# Patient Record
Sex: Female | Born: 1975
Health system: Southern US, Community
[De-identification: ages and names within clinical notes are randomized; demographics above are authoritative.]

## PROBLEM LIST (undated history)

## (undated) DIAGNOSIS — K589 Irritable bowel syndrome without diarrhea: Secondary | ICD-10-CM

## (undated) HISTORY — DX: Irritable bowel syndrome without diarrhea: K58.9

---

## 2006-07-23 ENCOUNTER — Other Ambulatory Visit: Payer: Self-pay

## 2006-07-24 ENCOUNTER — Observation Stay: Payer: Self-pay | Admitting: *Deleted

## 2009-11-21 ENCOUNTER — Ambulatory Visit: Payer: Self-pay | Admitting: Family Medicine

## 2009-12-03 ENCOUNTER — Ambulatory Visit: Payer: Self-pay | Admitting: Family Medicine

## 2012-02-14 ENCOUNTER — Ambulatory Visit: Payer: Self-pay | Admitting: Internal Medicine

## 2012-04-11 ENCOUNTER — Encounter: Payer: Self-pay | Admitting: Obstetrics and Gynecology

## 2012-07-06 ENCOUNTER — Observation Stay: Payer: Self-pay | Admitting: Obstetrics & Gynecology

## 2012-07-08 ENCOUNTER — Inpatient Hospital Stay: Payer: Self-pay | Admitting: Obstetrics and Gynecology

## 2012-07-08 LAB — BASIC METABOLIC PANEL
Anion Gap: 11 (ref 7–16)
BUN: 5 mg/dL — ABNORMAL LOW (ref 7–18)
Calcium, Total: 8.6 mg/dL (ref 8.5–10.1)
Chloride: 109 mmol/L — ABNORMAL HIGH (ref 98–107)
Co2: 19 mmol/L — ABNORMAL LOW (ref 21–32)
Creatinine: 0.5 mg/dL — ABNORMAL LOW (ref 0.60–1.30)
EGFR (African American): >60
EGFR (Non-African Amer.): >60
Glucose: 77 mg/dL (ref 65–99)
Osmolality: 274 (ref 275–301)
Potassium: 3.7 mmol/L (ref 3.5–5.1)
Sodium: 139 mmol/L (ref 136–145)

## 2012-07-08 LAB — HEMATOCRIT: HCT: 35 % (ref 35.0–47.0)

## 2012-07-08 LAB — PROTEIN / CREATININE RATIO, URINE
Creatinine, Urine: 156.7 mg/dL — ABNORMAL HIGH (ref 30.0–125.0)
Protein, Random Urine: 28 mg/dL — ABNORMAL HIGH (ref 0–12)
Protein/Creat. Ratio: 179 mg/gCREAT (ref 0–200)

## 2012-07-08 LAB — HEMOGLOBIN: HGB: 12.3 g/dL (ref 12.0–16.0)

## 2012-07-08 LAB — WBC: WBC: 9.8 10*3/uL (ref 3.6–11.0)

## 2012-07-08 LAB — RBC: RBC: 4.09 10*6/uL (ref 3.80–5.20)

## 2012-07-08 LAB — PLATELET COUNT: Platelet: 192 10*3/uL (ref 150–440)

## 2012-07-08 LAB — SGOT (AST)(ARMC): SGOT(AST): 17 U/L (ref 15–37)

## 2012-07-08 LAB — URIC ACID: Uric Acid: 4.4 mg/dL (ref 2.6–6.0)

## 2012-07-09 LAB — HEMATOCRIT: HCT: 27.5 % — ABNORMAL LOW (ref 35.0–47.0)

## 2013-05-02 ENCOUNTER — Ambulatory Visit: Payer: Self-pay | Admitting: Family Medicine

## 2013-11-15 ENCOUNTER — Emergency Department (HOSPITAL_COMMUNITY): Payer: No Typology Code available for payment source

## 2013-11-15 ENCOUNTER — Emergency Department (HOSPITAL_COMMUNITY)
Admission: EM | Admit: 2013-11-15 | Discharge: 2013-11-15 | Disposition: A | Discharge status: Home / Self Care | Payer: No Typology Code available for payment source | Attending: Emergency Medicine | Admitting: Emergency Medicine

## 2013-11-15 ENCOUNTER — Encounter (HOSPITAL_COMMUNITY): Payer: Self-pay | Admitting: Emergency Medicine

## 2013-11-15 DIAGNOSIS — Y92410 Unspecified street and highway as the place of occurrence of the external cause: Secondary | ICD-10-CM | POA: Insufficient documentation

## 2013-11-15 DIAGNOSIS — S8000XA Contusion of unspecified knee, initial encounter: Secondary | ICD-10-CM | POA: Insufficient documentation

## 2013-11-15 DIAGNOSIS — Y9389 Activity, other specified: Secondary | ICD-10-CM | POA: Insufficient documentation

## 2013-11-15 MED ORDER — HYDROCODONE-ACETAMINOPHEN 5-325 MG PO TABS
1.0000 | ORAL_TABLET | Freq: Once | ORAL | Status: AC
  Administered 2013-11-15: 1 via ORAL
  Filled 2013-11-15: qty 1

## 2013-11-15 MED ORDER — CYCLOBENZAPRINE HCL 10 MG PO TABS: 10.0000 mg | ORAL_TABLET | Freq: Two times a day (BID) | ORAL | Status: DC | PRN

## 2013-11-15 MED ORDER — IBUPROFEN 800 MG PO TABS: 800.0000 mg | ORAL_TABLET | Freq: Three times a day (TID) | ORAL | Status: DC

## 2013-11-15 MED ORDER — HYDROCODONE-ACETAMINOPHEN 5-325 MG PO TABS: 1.0000 | ORAL_TABLET | ORAL | Status: DC | PRN

## 2013-11-15 NOTE — Discharge Instructions (Signed)
Contusion °A contusion is a deep bruise. Contusions are the result of an injury that caused bleeding under the skin. The contusion may turn blue, purple, or yellow. Minor injuries will give you a painless contusion, but more severe contusions may stay painful and swollen for a few weeks.  °CAUSES  °A contusion is usually caused by a blow, trauma, or direct force to an area of the body. °SYMPTOMS  °· Swelling and redness of the injured area. °· Bruising of the injured area. °· Tenderness and soreness of the injured area. °· Pain. °DIAGNOSIS  °The diagnosis can be made by taking a history and physical exam. An X-ray, CT scan, or MRI may be needed to determine if there were any associated injuries, such as fractures. °TREATMENT  °Specific treatment will depend on what area of the body was injured. In general, the best treatment for a contusion is resting, icing, elevating, and applying cold compresses to the injured area. Over-the-counter medicines may also be recommended for pain control. Ask your caregiver what the best treatment is for your contusion. °HOME CARE INSTRUCTIONS  °· Put ice on the injured area. °· Put ice in a plastic bag. °· Place a towel between your skin and the bag. °· Leave the ice on for 15-20 minutes, 03-04 times a day. °· Only take over-the-counter or prescription medicines for pain, discomfort, or fever as directed by your caregiver. Your caregiver may recommend avoiding anti-inflammatory medicines (aspirin, ibuprofen, and naproxen) for 48 hours because these medicines may increase bruising. °· Rest the injured area. °· If possible, elevate the injured area to reduce swelling. °SEEK IMMEDIATE MEDICAL CARE IF:  °· You have increased bruising or swelling. °· You have pain that is getting worse. °· Your swelling or pain is not relieved with medicines. °MAKE SURE YOU:  °· Understand these instructions. °· Will watch your condition. °· Will get help right away if you are not doing well or get  worse. °Document Released: 05/20/2005 Document Revised: 11/02/2011 Document Reviewed: 06/15/2011 °[employer]® Patient Information ©2014 [employer]. ° °Cryotherapy °Cryotherapy means treatment with cold. Ice or gel packs can be used to reduce both pain and swelling. Ice is the most helpful within the first 24 to 48 hours after an injury or flareup from overusing a muscle or joint. Sprains, strains, spasms, burning pain, shooting pain, and aches can all be eased with ice. Ice can also be used when recovering from surgery. Ice is effective, has very few side effects, and is safe for most people to use. °PRECAUTIONS  °Ice is not a safe treatment option for people with: °· Raynaud's phenomenon. This is a condition affecting small blood vessels in the extremities. Exposure to cold may cause your problems to return. °· Cold hypersensitivity. There are many forms of cold hypersensitivity, including: °· Cold urticaria. Red, itchy hives appear on the skin when the tissues begin to warm after being iced. °· Cold erythema. This is a red, itchy rash caused by exposure to cold. °· Cold hemoglobinuria. Red blood cells break down when the tissues begin to warm after being iced. The hemoglobin that carry oxygen are passed into the urine because they cannot combine with blood proteins fast enough. °· Numbness or altered sensitivity in the area being iced. °If you have any of the following conditions, do not use ice until you have discussed cryotherapy with your caregiver: °· Heart conditions, such as arrhythmia, angina, or chronic heart disease. °· High blood pressure. °· Healing wounds or open skin   in the area being iced.  Current infections.  Rheumatoid arthritis.  Poor circulation.  Diabetes. Ice slows the blood flow in the region it is applied. This is beneficial when trying to stop inflamed tissues from spreading irritating chemicals to surrounding tissues. However, if you expose your skin to cold temperatures for too  long or without the proper protection, you can damage your skin or nerves. Watch for signs of skin damage due to cold. HOME CARE INSTRUCTIONS Follow these tips to use ice and cold packs safely.  Place a dry or damp towel between the ice and skin. A damp towel will cool the skin more quickly, so you may need to shorten the time that the ice is used.  For a more rapid response, add gentle compression to the ice.  Ice for no more than 10 to 20 minutes at a time. The bonier the area you are icing, the less time it will take to get the benefits of ice.  Check your skin after 5 minutes to make sure there are no signs of a poor response to cold or skin damage.  Rest 20 minutes or more in between uses.  Once your skin is numb, you can end your treatment. You can test numbness by very lightly touching your skin. The touch should be so light that you do not see the skin dimple from the pressure of your fingertip. When using ice, most people will feel these normal sensations in this order: cold, burning, aching, and numbness.  Do not use ice on someone who cannot communicate their responses to pain, such as small children or people with dementia. HOW TO MAKE AN ICE PACK Ice packs are the most common way to use ice therapy. Other methods include ice massage, ice baths, and cryo-sprays. Muscle creams that cause a cold, tingly feeling do not offer the same benefits that ice offers and should not be used as a substitute unless recommended by your caregiver. To make an ice pack, do one of the following:  Place crushed ice or a bag of frozen vegetables in a sealable plastic bag. Squeeze out the excess air. Place this bag inside another plastic bag. Slide the bag into a pillowcase or place a damp towel between your skin and the bag.  Mix 3 parts water with 1 part rubbing alcohol. Freeze the mixture in a sealable plastic bag. When you remove the mixture from the freezer, it will be slushy. Squeeze out the excess  air. Place this bag inside another plastic bag. Slide the bag into a pillowcase or place a damp towel between your skin and the bag. SEEK MEDICAL CARE IF:  You develop white spots on your skin. This may give the skin a blotchy (mottled) appearance.  Your skin turns blue or pale.  Your skin becomes waxy or hard.  Your swelling gets worse. MAKE SURE YOU:   Understand these instructions.  Will watch your condition.  Will get help right away if you are not doing well or get worse. Document Released: 04/06/2011 Document Revised: 11/02/2011 Document Reviewed: 04/06/2011 [facility] Patient Information 2014 [place], [place]. Motor Vehicle Collision  It is common to have multiple bruises and sore muscles after a motor vehicle collision (MVC). These tend to feel worse for the first 24 hours. You may have the most stiffness and soreness over the first several hours. You may also feel worse when you wake up the first morning after your collision. After this point, you will usually begin to  improve with each day. The speed of improvement often depends on the severity of the collision, the number of injuries, and the location and nature of these injuries. HOME CARE INSTRUCTIONS   Put ice on the injured area.  Put ice in a plastic bag.  Place a towel between your skin and the bag.  Leave the ice on for 15-20 minutes, 03-04 times a day.  Drink enough fluids to keep your urine clear or pale yellow. Do not drink alcohol.  Take a warm shower or bath once or twice a day. This will increase blood flow to sore muscles.  You may return to activities as directed by your caregiver. Be careful when lifting, as this may aggravate neck or back pain.  Only take over-the-counter or prescription medicines for pain, discomfort, or fever as directed by your caregiver. Do not use aspirin. This may increase bruising and bleeding. SEEK IMMEDIATE MEDICAL CARE IF:  You have numbness, tingling, or weakness in the arms  or legs.  You develop severe headaches not relieved with medicine.  You have severe neck pain, especially tenderness in the middle of the back of your neck.  You have changes in bowel or bladder control.  There is increasing pain in any area of the body.  You have shortness of breath, lightheadedness, dizziness, or fainting.  You have chest pain.  You feel sick to your stomach (nauseous), throw up (vomit), or sweat.  You have increasing abdominal discomfort.  There is blood in your urine, stool, or vomit.  You have pain in your shoulder (shoulder strap areas).  You feel your symptoms are getting worse. MAKE SURE YOU:   Understand these instructions.  Will watch your condition.  Will get help right away if you are not doing well or get worse. Document Released: 08/10/2005 Document Revised: 11/02/2011 Document Reviewed: 01/07/2011 [facility] Patient Information 2014 [place], [place].

## 2013-11-15 NOTE — ED Notes (Signed)
No LOC. Ice applied to both knees.
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
## 2013-11-15 NOTE — ED Notes (Signed)
Returned from xray
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
## 2013-11-24 NOTE — ED Provider Notes (Signed)
Medical screening examination/treatment/procedure(s) were performed by non-physician practitioner and as supervising physician I was immediately available for consultation/collaboration.   EKG Interpretation None        Girl Schissler S Marytza Grandpre, MD 11/24/13 1519 

## 2014-12-11 NOTE — Op Note (Signed)
PATIENT NAME:  Angela Mcneil, Mcneil MR#:  [id number] DATE OF BIRTH:  Nov 03, 1975  DATE OF PROCEDURE:  07/08/2012  PREOPERATIVE DIAGNOSES:  1. Term intrauterine pregnancy at 39 weeks 0 days gestation.  2. Fetal intolerance to labor.   POSTOPERATIVE DIAGNOSES:  1. Term intrauterine pregnancy at 39 weeks 0 days gestation.  2. Fetal intolerance to labor.   PROCEDURE PERFORMED: Primary low transverse cesarean section via Pfannenstiel skin incision.   ANESTHESIA USED: Spinal.   PRIMARY SURGEON: Florina Ou. Bonney Aid, MD  ESTIMATED BLOOD LOSS: 500 mL.  OPERATIVE FLUIDS: 1300 mL of crystalloid.   URINE OUTPUT: 75 mL of clear urine.   COMPLICATIONS: None.   INTRAOPERATIVE FINDINGS: Normal tubes and ovaries. The uterus was significant for four pedunculated fibroids in the posterior aspect of the left portion of the uterus. The largest of these measured around 4.5 cm. Delivery resulted in birth of a liveborn female infant weighing 2600 grams (5 pounds 2 ounces), Apgars 8 and 9.   DRAINS OR TUBES: Foley to gravity drainage, On-Q catheter system.   PREOPERATIVE ANTIBIOTIC: 2 grams Ancef.   SPECIMENS REMOVED: Placenta.   PATIENT CONDITION FOLLOWING PROCEDURE: Stable.   PROCEDURE IN DETAIL: Risks, benefits, and alternatives of the procedure were discussed with patient prior to proceeding to the Operating Room. The patient had presented in term labor having made change from 1 cm to 3 cm over two hours of monitoring. She displayed reassuring fetal surveillance on admission but had an unprovoked deceleration down to 60 beats per minute which lasted approximately nine minutes and did not respond to position changes, supplemental O2 or fetal scalp stem. Upon recovery the infant was noted to be tachycardiac into the 170s with minimal variability and still displaying intermittent decelerations. These were subtle down to the 160s-150s, lasting two or three minutes each. The decision was made given that she remote  from delivery and the infant did not respond to in utero resuscitative measures, the appropriate thing would be to proceed with operative delivery. The patient was in agreement. She was taken to the Operating Room where she was placed under spinal anesthesia. She was positioned in the supine position, prepped and draped in the usual sterile fashion. A timeout was performed. A Pfannenstiel skin incision was made 2 cm above the pubic symphysis, carried down sharply to the level of the rectus fascia. The fascia was incised in the midline and the fascial incision was extended using Mayo scissors. The superior border of the rectus fascia was grasped with two Kocher clamps. The underlying muscles was dissected off the rectus fascia using blunt dissection. Median raphe was incised using Mayo scissors. The inferior border of the rectus fascia was dissected off the rectus muscle in a similar fashion. The midline was identified. The rectus muscles were separated bluntly and the peritoneum was entered bluntly. The peritoneal incision was extended using manual traction. A bladder blade was placed. A bladder flap was then created using Metzenbaum scissors and developed digitally. The bladder blade was replaced retracting the bladder caudad. Following this, hysterotomy incision was made on the uterus. After scoring the hysterotomy incision the uterus was entered bluntly using the operator's finger. The hysterotomy incision was extended using manual traction. Upon placing the operator's hand into the uterus the infant was noted to be in the OA position. The vertex was grasped, flexed, brought to the incision, delivered atraumatically using fundal pressure. The remainder of the infant's body delivered without difficulty. The infant was suctioned, cord was clamped and cut.  The infant was passed to the awaiting pediatrician. Cord blood was obtained. The placenta was delivered using manual extraction. The uterus was exteriorized with  some difficulty at which time the above noted fibroids were noted. The uterus was wiped clean of clots and debris. The hysterotomy incision was closed using a two layer closure of 0 Vicryl with the first being a running locked, the second a vertical imbricating layer. The hysterotomy incision was inspected and noted to be hemostatic. The uterus returned to the abdomen after irrigating the pelvis with warm saline. Following return of the uterus into the abdomen the peritoneum was closed using 2-0 Vicryl in a running fashion. The On-Q catheter system was placed 4 cm above the midline and placed subfascially. The trocars were removed and the On-Q catheters were threaded through the introducers and the introducers were removed. The fascia was closed using a single #1 looped PDS in a running fashion, subcutaneous tissue was then irrigated. Hemostasis achieved using the Bovie. The skin was closed using Insorb staples. The On-Q catheters were dressed with Dermabond as was the Pfannenstiel skin incision. Each On-Q catheter was then bolused with 5 mL of 0.5% bupivacaine each. Sponge, needle, and instrument counts were correct x2. The patient tolerated the procedure well, was taken to the recovery room in stable condition.   ____________________________ Florina OuAndreas M. Bonney AidStaebler, MD ams:cms D: 07/09/2012 23:17:00 ET T: 07/10/2012 12:49:41 ET JOB#: [id number]  cc: Florina OuAndreas M. Bonney AidStaebler, MD, <Dictator>  Lorrene ReidANDREAS M Tayleigh Wetherell MD ELECTRONICALLY SIGNED 07/12/2012 22:03

## 2015-01-01 NOTE — H&P (Signed)
L&D Evaluation:  History:   HPI 39yo G2P0010 at 2066w0d by D=7wk U/S presents contractions has been 1cm over last week.  [facility] uncom[facility] aside from elevated DSR 1:180 (no further work up), fibroid uterus with pedunculated fibroid 4x5cm and LUS fibroid 3x4cm. PNL - O-, R NI, VI, ASCUS HPV+ pap (benign colpo).    Patient's Medical History HTN (normotensive throughout pregnancy), leiomyomata, kidney calculi s/p stenting    Patient's Surgical History D&C  lithotripsy, ureteral stenting    Medications Pre Natal Vitamins    Allergies darvocet - hallucinations    Social History none    Family History Non-Contributory   ROS:   ROS All systems were reviewed.  HEENT, CNS, GI, GU, Respiratory, CV, Renal and Musculoskeletal systems were found to be normal.   Exam:   Vital Signs stable    General no apparent distress    Mental Status clear    Chest nl effort    Abdomen gravid, non-tender    Estimated Fetal Weight Average for gestational age    Fetal Position vtx    Pelvic 1 / 50 on admission made changed to 3/50/-3    Mebranes Intact    FHT normal rate with no decels    Ucx irregular, q3-365min   Plan:   Plan EFM/NST, monitor BP, admit for term labor    Comments - preE labs sent given several borderline BP's in 130's, returned normal.  Pr/Cr ratio pending.  Has history of CHTN    Electronic Signatures: Lorrene ReidStaebler, Russel Morain M (MD)  (Signed [phone]-Nov-13 11:28)  Authored: L&D Evaluation   Last Updated: 15-Nov-13 11:28 by Lorrene ReidStaebler, Aniketh Huberty M (MD)

## 2015-01-01 NOTE — H&P (Signed)
L&D Evaluation:  History:   HPI 39yo G2P0010 at 5952w5d by D=7wk U/S presents with lower abdomen / pelvic pressure and occasional contractions.  Checked in office last week and found to be 1cm.  [facility] aside from elevated DSR 1:180 (no further work up), fibroid uterus with pedunculated fibroid 4x5cm and LUS fibroid 3x4cm. PNL - O-, R NI, VI, ASCUS HPV+ pap (benign colpo).    Patient's Medical History HTN (normotensive throughout pregnancy), leiomyomata, kidney calculi s/p stenting    Patient's Surgical History D&C  lithotripsy, ureteral stenting    Medications Pre Natal Vitamins    Allergies darvocet - hallucinations    Social History none    Family History Non-Contributory   ROS:   ROS All systems were reviewed.  HEENT, CNS, GI, GU, Respiratory, CV, Renal and Musculoskeletal systems were found to be normal.   Exam:   Vital Signs stable    General no apparent distress    Mental Status clear    Chest nl effort    Abdomen gravid, non-tender    Estimated Fetal Weight Average for gestational age    Pelvic 1 / 450    Mebranes Intact    FHT normal rate with no decels, 140s, moderate variability, + accels, no decels    Ucx irregular, every 5-2510min   Impression:   Impression Presented for r/o labor - not currently in labor   Plan:   [facility]    Follow Up Appointment already scheduled. in 2 days   Electronic Signatures: Garnette GunnerStansbury Clipp, Ali LoweEryn K (MD)  (Signed [phone]-Nov-13 20:17)  Authored: L&D Evaluation   Last Updated: 13-Nov-13 20:17 by Garnette GunnerStansbury Clipp, Ali LoweEryn K (MD)

## 2015-12-01 ENCOUNTER — Encounter: Payer: Self-pay | Admitting: *Deleted

## 2015-12-01 ENCOUNTER — Ambulatory Visit
Admission: EM | Admit: 2015-12-01 | Discharge: 2015-12-01 | Disposition: A | Discharge status: Home / Self Care | Payer: BLUE CROSS/BLUE SHIELD | Attending: Family Medicine | Admitting: Family Medicine

## 2015-12-01 DIAGNOSIS — B349 Viral infection, unspecified: Secondary | ICD-10-CM

## 2015-12-01 DIAGNOSIS — J40 Bronchitis, not specified as acute or chronic: Secondary | ICD-10-CM | POA: Diagnosis not present

## 2015-12-01 LAB — RAPID INFLUENZA A&B ANTIGENS
Influenza A (ARMC): NEGATIVE
Influenza B (ARMC): NEGATIVE

## 2015-12-01 LAB — RAPID STREP SCREEN (MED CTR MEBANE ONLY): Streptococcus, Group A Screen (Direct): NEGATIVE

## 2015-12-01 MED ORDER — AZITHROMYCIN 250 MG PO TABS: ORAL_TABLET | ORAL | Status: DC

## 2015-12-01 MED ORDER — HYDROCOD POLST-CPM POLST ER 10-8 MG/5ML PO SUER: 5.0000 mL | Freq: Every evening | ORAL | Status: AC | PRN

## 2015-12-01 MED ORDER — BENZONATATE 100 MG PO CAPS: 100.0000 mg | ORAL_CAPSULE | Freq: Three times a day (TID) | ORAL | Status: DC | PRN

## 2015-12-01 NOTE — ED Notes (Signed)
Pt states that she has had headache, fever, cough, congestion since Friday.

## 2015-12-01 NOTE — ED Provider Notes (Signed)
[facility]  ____________________________________________  Time seen: Approximately 4:00 PM  I have reviewed the triage vital signs and the nursing notes.   HISTORY  Chief Complaint Fever and Cough  HPI Angela Mcneil is a 40 y.o. female presents with complaints of 4 days of runny nose, nasal congestion, cough and intermittent fever. Reports continues to drink fluids well but with decreased appetite. Denies sick contacts at home. States that biggest complaint is cough. Denies pain.  Denies chest pain, shortness breath, chest pain with deep breath, abdominal pain, dysuria, neck pain, back pain, extremity pain or extremity swelling.  PCP: [facility] Patient's last menstrual period was 11/13/2015 (approximate). Denies chance of pregnancy.   History reviewed. No pertinent past medical history.  There are no active problems to display for this patient.   Past Surgical History  Procedure Laterality Date  . Cesarean section  2013    Current Outpatient Rx  Name  Route  Sig  Dispense  Refill  .           .           .           .           .           .             Allergies Review of patient's allergies indicates no known allergies.  No family history on file.  Social History Social History  Substance Use Topics  . Smoking status: Never Smoker   . Smokeless tobacco: None  . Alcohol Use: Yes    Review of Systems Constitutional: As above Eyes: No visual changes. ENT: States mild occasional sore throat. Cardiovascular: Denies chest pain. Respiratory: Denies shortness of breath. Gastrointestinal: No abdominal pain.  No nausea, no vomiting.  No diarrhea.  No constipation. Genitourinary: Negative for dysuria. Musculoskeletal: Negative for back pain. Skin: Negative for rash. Neurological: Negative for headaches, focal weakness or numbness.  10-point ROS otherwise negative.  ____________________________________________   PHYSICAL EXAM:  VITAL SIGNS: ED  Triage Vitals  Enc Vitals Group     BP 12/01/15 1513 127/88 mmHg     Pulse Rate 12/01/15 1513 84     Resp -- 18     Temp 12/01/15 1513 97.9 F (36.6 C)     Temp Source 12/01/15 1513 Oral     SpO2 12/01/15 1513 99 %     Weight 12/01/15 1513 145 lb (65.772 kg)     Height 12/01/15 1513 5\' 3"  (1.6 m)     Head Cir --      Peak Flow --      Pain Score --      Pain Loc --      Pain Edu? --      Excl. in GC? --   Constitutional: Alert and oriented. Well appearing and in no acute distress. Eyes: Conjunctivae are normal. PERRL. EOMI. Head: Atraumatic. No sinus tenderness to palpation. No swelling. No erythema.  Ears: no erythema, normal TMs bilaterally.   Nose:Nasal congestion with clear rhinorrhea  Mouth/Throat: Mucous membranes are moist. Mild pharyngeal erythema. No tonsillar swelling or exudate.  Neck: No stridor.  No cervical spine tenderness to palpation. Hematological/Lymphatic/Immunilogical: No cervical lymphadenopathy. Cardiovascular: Normal rate, regular rhythm. Grossly normal heart sounds.  Good peripheral circulation. Respiratory: Normal respiratory effort.  No retractions. Mild scattered rhonchi. No wheezes or rales. Dry intermittent cough noted in room. Good air movement.  Gastrointestinal: Soft and  nontender. Normal Bowel sounds. No CVA tenderness. Musculoskeletal: No lower or upper extremity tenderness nor edema. No cervical, thoracic or lumbar tenderness to palpation. Bilateral calf's nontender. Neurologic:  Normal speech and language. No gross focal neurologic deficits are appreciated. No gait instability. Skin:  Skin is warm, dry and intact. No rash noted. Psychiatric: Mood and affect are normal. Speech and behavior are normal.  ____________________________________________   LABS (all labs ordered are listed, but only abnormal results are displayed)  Labs Reviewed  RAPID INFLUENZA A&B ANTIGENS ([facility] ONLY)  RAPID STREP SCREEN (NOT AT [facility])  CULTURE, GROUP A STREP  [facility])    INITIAL IMPRESSION / ASSESSMENT AND PLAN / ED COURSE  Pertinent labs & imaging results that were available during my care of the patient were reviewed by me and considered in my medical decision making (see chart for details).  Very well-appearing patient in no acute distress. Presents for the complaints of 4 days runny nose, nasal congestion, cough and fever. Mild scattered rhonchi. Otherwise lungs clear throughout. Clear rhinorrhea. Suspect bronchitis. Will treat with oral azithromycin, when necessary Tessalon Perles and when necessary Tussionex at night. Encouraged rest, fluids, over-the-counter Tylenol or ibuprofen as needed. Discussed indication, risks and benefits of medications with patient. Work note for tomorrow given.  Discussed follow up with Primary care physician this week. Discussed follow up and return parameters including no resolution or any worsening concerns. Patient verbalized understanding and agreed to plan.   ____________________________________________   FINAL CLINICAL IMPRESSION(S) / ED DIAGNOSES  Final diagnoses:  Bronchitis  Viral illness      Note: This dictation was prepared with Dragon dictation along with smaller phrase technology. Any transcriptional errors that result from this process are unintentional.   Renford Dills, NP 12/01/15 1615  Renford Dills, NP 12/01/15 [phone]

## 2015-12-01 NOTE — Discharge Instructions (Signed)
Take medication as prescribed. Rest. Drink plenty of fluids.  ° °Follow up with your primary care physician this week as needed. Return to Urgent care for new or worsening concerns.  ° °

## 2015-12-03 LAB — CULTURE, GROUP A STREP (THRC)

## 2016-03-15 ENCOUNTER — Emergency Department
Admission: EM | Admit: 2016-03-15 | Discharge: 2016-03-15 | Disposition: A | Discharge status: Home / Self Care | Payer: BLUE CROSS/BLUE SHIELD

## 2016-03-15 DIAGNOSIS — R030 Elevated blood-pressure reading, without diagnosis of hypertension: Secondary | ICD-10-CM | POA: Diagnosis not present

## 2016-03-15 DIAGNOSIS — J019 Acute sinusitis, unspecified: Secondary | ICD-10-CM | POA: Diagnosis not present

## 2016-03-15 NOTE — ED Triage Notes (Addendum)
Pt called several times with no answer for triage.

## 2016-03-15 NOTE — ED Notes (Addendum)
Pt called one last time and is not in waiting room

## 2016-05-18 DIAGNOSIS — Z23 Encounter for immunization: Secondary | ICD-10-CM | POA: Diagnosis not present

## 2016-05-22 DIAGNOSIS — J029 Acute pharyngitis, unspecified: Secondary | ICD-10-CM | POA: Diagnosis not present

## 2016-05-22 DIAGNOSIS — R030 Elevated blood-pressure reading, without diagnosis of hypertension: Secondary | ICD-10-CM | POA: Diagnosis not present

## 2016-05-22 DIAGNOSIS — J328 Other chronic sinusitis: Secondary | ICD-10-CM | POA: Diagnosis not present

## 2016-05-22 DIAGNOSIS — R05 Cough: Secondary | ICD-10-CM | POA: Diagnosis not present

## 2016-06-12 DIAGNOSIS — Z01419 Encounter for gynecological examination (general) (routine) without abnormal findings: Secondary | ICD-10-CM | POA: Diagnosis not present

## 2016-06-13 ENCOUNTER — Ambulatory Visit
Admission: EM | Admit: 2016-06-13 | Discharge: 2016-06-13 | Disposition: A | Discharge status: Home / Self Care | Payer: BLUE CROSS/BLUE SHIELD | Attending: Registered Nurse | Admitting: Registered Nurse

## 2016-06-13 DIAGNOSIS — L03211 Cellulitis of face: Secondary | ICD-10-CM | POA: Diagnosis not present

## 2016-06-13 MED ORDER — CEFTRIAXONE SODIUM 1 G IJ SOLR
1.0000 g | Freq: Once | INTRAMUSCULAR | Status: AC
Start: 2016-06-13 — End: 2016-06-13
  Administered 2016-06-13: 1 g via INTRAMUSCULAR

## 2016-06-13 MED ORDER — SULFAMETHOXAZOLE-TRIMETHOPRIM 800-160 MG PO TABS: 1.0000 | ORAL_TABLET | Freq: Two times a day (BID) | ORAL | 0 refills | Status: AC

## 2016-06-13 MED ORDER — IBUPROFEN 800 MG PO TABS: 800.0000 mg | ORAL_TABLET | Freq: Three times a day (TID) | ORAL | 0 refills | Status: AC | PRN

## 2016-06-13 MED ORDER — METRONIDAZOLE 500 MG PO TABS: 500.0000 mg | ORAL_TABLET | Freq: Four times a day (QID) | ORAL | 0 refills | Status: AC

## 2016-06-13 MED ORDER — HYDROCODONE-ACETAMINOPHEN 5-500 MG PO TABS: 1.0000 | ORAL_TABLET | Freq: Three times a day (TID) | ORAL | 0 refills | Status: AC | PRN

## 2016-06-13 NOTE — ED Triage Notes (Signed)
Patient c/o facial swelling, jaw swollen, very painful and hot to touch, and red.

## 2016-06-13 NOTE — ED Provider Notes (Signed)
CSN: [id number]     Arrival date & time 06/13/16  1139 History   None    Chief Complaint  Patient presents with  . Facial Pain    Right Side   (Consider location/radiation/quality/duration/timing/severity/associated sxs/prior Treatment) Married caucasian female here for evaluation right facial swelling started yesterday scratched pimple on chin.  Swelling increased overnight from chin to inside mouth now.  Denied difficulty swallowing/talking/breathing.  Denied fever.  Small amount discharge chin noted but none in mouth.  Denied dental problems/hx abscess/cavities/fractured fillings.  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  Denied history of MRSA infections/trauma to mouth/face.  Recently treated for cough and cold given cheratussin ran out a couple days ago.  PMHx healthy  FHx denied cancer      History reviewed. No pertinent past medical history. Past Surgical History:  Procedure Laterality Date  . CESAREAN SECTION  2013   History reviewed. No pertinent family history. Social History  Substance Use Topics  . Smoking status: Never Smoker  . Smokeless tobacco: Never Used  . Alcohol use Yes   OB History    No data available     Review of Systems  Constitutional: Negative for activity change, appetite change, chills, diaphoresis, fatigue, fever and unexpected weight change.  HENT: Positive for facial swelling and mouth sores. Negative for congestion, dental problem, drooling, ear discharge, ear pain, hearing loss, nosebleeds, postnasal drip, rhinorrhea, sinus pressure, sneezing, sore throat, tinnitus, trouble swallowing and voice change.   Eyes: Negative for photophobia, pain, discharge, redness, itching and visual disturbance.  Respiratory: Negative for cough, choking, chest tightness, shortness of breath, wheezing and stridor.   Cardiovascular: Negative for chest pain, palpitations and leg swelling.  Gastrointestinal: Negative for abdominal distention, abdominal  pain, blood in stool, constipation, diarrhea, nausea and vomiting.  Endocrine: Negative for cold intolerance and heat intolerance.  Genitourinary: Negative for difficulty urinating, dysuria and hematuria.  Musculoskeletal: Negative for arthralgias, back pain, gait problem, joint swelling, myalgias, neck pain and neck stiffness.  Skin: Negative for color change, pallor, rash and wound.  Allergic/Immunologic: Positive for environmental allergies. Negative for food allergies.  Neurological: Negative for dizziness, tremors, seizures, syncope, facial asymmetry, speech difficulty, weakness, light-headedness, numbness and headaches.  Hematological: Negative for adenopathy. Does not bruise/bleed easily.  Psychiatric/Behavioral: Negative for agitation, behavioral problems, confusion and sleep disturbance.    Allergies  Review of patient's allergies indicates no known allergies.  Home Medications   Prior to Admission medications   Medication Sig Start Date End Date Taking? Authorizing Provider  ibuprofen (ADVIL,MOTRIN) 800 MG tablet Take 1 tablet (800 mg total) by mouth every 8 (eight) hours as needed. 06/13/16 06/23/16  Barbaraann Barthelina A Edsel Shives, NP  metroNIDAZOLE (FLAGYL) 500 MG tablet Take 1 tablet (500 mg total) by mouth 4 (four) times daily. 06/13/16 06/23/16  Barbaraann Barthelina A Ryshawn Sanzone, NP  sulfamethoxazole-trimethoprim (BACTRIM DS,SEPTRA DS) 800-160 MG tablet Take 1 tablet by mouth 2 (two) times daily. 06/13/16 06/27/16  Barbaraann Barthelina A Carys Malina, NP   Meds Ordered and Administered this Visit   Medications  cefTRIAXone (ROCEPHIN) injection 1 g (1 g Intramuscular Given 06/13/16 1231)    BP (!) 138/91 (BP Location: Left Arm)   Pulse (!) 109   Temp 98.3 F (36.8 C) (Oral)   Resp 18   Ht 5\' 3"  (1.6 m)   Wt 140 lb (63.5 kg)   LMP 05/30/2016   SpO2 98%   BMI 24.80 kg/m  No data found.   Physical Exam  Constitutional: She is oriented  to person, place, and time. Vital signs are normal. She appears  well-developed and well-nourished. She is active and cooperative.  Non-toxic appearance. She does not have a sickly appearance. She appears ill. No distress. She is not intubated.  HENT:  Head: Normocephalic. Not macrocephalic and not microcephalic. Head is with abrasion. Head is without raccoon's eyes, without Battle's sign, without contusion, without laceration, without right periorbital erythema and without left periorbital erythema. Hair is normal.    Right Ear: Hearing, external ear and ear canal normal. A middle ear effusion is present.  Left Ear: Hearing, external ear and ear canal normal. A middle ear effusion is present.  Nose: Mucosal edema present. No rhinorrhea, nose lacerations, sinus tenderness, nasal deformity, septal deviation or nasal septal hematoma. No epistaxis.  No foreign bodies. Right sinus exhibits no maxillary sinus tenderness and no frontal sinus tenderness. Left sinus exhibits no maxillary sinus tenderness and no frontal sinus tenderness.  Mouth/Throat: Uvula is midline and mucous membranes are normal. Mucous membranes are not pale, not dry and not cyanotic. She does not have dentures. Oral lesions present. There is trismus in the jaw. Normal dentition. No dental abscesses, uvula swelling, lacerations or dental caries. Posterior oropharyngeal edema and posterior oropharyngeal erythema present. No oropharyngeal exudate or tonsillar abscesses. Tonsils are 0 on the right. Tonsils are 0 on the left. No tonsillar exudate.  Bilateral TMs with air fluid level clear; cobblestoning posterior pharynx; bilateral allergic shiners; bilateral nasal turbinates edema/erythema clear discharge; buccal mucosa swelling nonpitting right and floor mouth right slightly TTP nonpitting no drainage; chin right anterior with abrasion dry honey crusted lesion and macular erythema localized; voice clear full sentences no drooling  Eyes: Conjunctivae, EOM and lids are normal. Pupils are equal, round, and  reactive to light. Right eye exhibits no chemosis, no discharge, no exudate and no hordeolum. No foreign body present in the right eye. Left eye exhibits no chemosis, no discharge, no exudate and no hordeolum. No foreign body present in the left eye. Right conjunctiva is not injected. Right conjunctiva has no hemorrhage. Left conjunctiva is not injected. Left conjunctiva has no hemorrhage. No scleral icterus. Right eye exhibits normal extraocular motion and no nystagmus. Left eye exhibits normal extraocular motion and no nystagmus. Right pupil is round and reactive. Left pupil is round and reactive. Pupils are equal.  Neck: Trachea normal, normal range of motion and phonation normal. Neck supple. No tracheal tenderness, no spinous process tenderness and no muscular tenderness present. No neck rigidity. No tracheal deviation, no edema, no erythema and normal range of motion present. No thyroid mass and no thyromegaly present.  Cardiovascular: Normal rate, regular rhythm, S1 normal, S2 normal, normal heart sounds and intact distal pulses.  PMI is not displaced.  Exam reveals no gallop and no friction rub.   No murmur heard. Pulmonary/Chest: Effort normal and breath sounds normal. No accessory muscle usage or stridor. No apnea, no tachypnea and no bradypnea. She is not intubated. No respiratory distress. She has no decreased breath sounds. She has no wheezes. She has no rhonchi. She has no rales. She exhibits no tenderness.  Abdominal: Soft. She exhibits no distension.  Musculoskeletal: Normal range of motion. She exhibits no edema or tenderness.       Right shoulder: Normal.       Left shoulder: Normal.       Right hip: Normal.       Left hip: Normal.       Right knee: Normal.  Left knee: Normal.       Cervical back: Normal.       Right hand: Normal.       Left hand: Normal.  Lymphadenopathy:       Head (right side): No submental, no submandibular, no tonsillar, no preauricular, no posterior  auricular and no occipital adenopathy present.       Head (left side): No submental, no submandibular, no tonsillar, no preauricular, no posterior auricular and no occipital adenopathy present.    She has no cervical adenopathy.       Right cervical: No superficial cervical, no deep cervical and no posterior cervical adenopathy present.      Left cervical: No superficial cervical, no deep cervical and no posterior cervical adenopathy present.  TTP submental right and central but no palpable nodules/masses/induration  Neurological: She is alert and oriented to person, place, and time. She has normal strength. She is not disoriented. She displays no atrophy and no tremor. No cranial nerve deficit or sensory deficit. She exhibits normal muscle tone. She displays no seizure activity. Coordination and gait normal. GCS eye subscore is 4. GCS verbal subscore is 5. GCS motor subscore is 6.  Skin: Skin is warm and dry. Capillary refill takes less than 2 seconds. Abrasion and rash noted. No bruising, no burn, no ecchymosis, no laceration, no lesion, no petechiae and no purpura noted. Rash is macular, papular and maculopapular. Rash is not nodular, not pustular, not vesicular and not urticarial. She is not diaphoretic. There is erythema. No cyanosis. No pallor. Nails show no clubbing.  Psychiatric: She has a normal mood and affect. Her speech is normal and behavior is normal. Judgment and thought content normal. She is not actively hallucinating. Cognition and memory are normal. She is attentive.  Nursing note and vitals reviewed.   Urgent Care Course   Clinical Course    Procedures (including critical care time)  Labs Review Labs Reviewed - No data to display  Imaging Review No results found.  Ceftriaxone 1000mg  IM x 1 given by RN Gretchen Short at [phone].  Reviewed [place] Controlled Substances website and 3 Rx for hydrocodone chlorphen ER suspension received in April, September and October and  alprazolam 0.25mg  September 8 tabs.  Patient asking for narcotic pain medication today.  1310 Tolerated popsicle without difficulty/nausea/vomiting/dysphagia/dysphasia  Vitals:   06/13/16 1201 06/13/16 1202 06/13/16 1315  BP:  (!) 138/91 124/81  Pulse:  (!) 109 91  Resp:  18 18  Temp:  98.3 F (36.8 C)   TempSrc:  Oral   SpO2:  98% 98%  Weight: 140 lb (63.5 kg)    Height: 5\' 3"  (1.6 m)       MDM   1. Facial cellulitis    ceftriaxone 1000mg  IM x 1 now by Gretchen Short.  Metronidazole 500mg  po QID x 10 days.  Bactrim ds po BID x 14 days.  Bactroban ointment BID x 14 days.  Tylenol 1000mg  po QID prn pain.  Exitcare handout on skin infection given to patient.  RTC if worsening erythema, pain, purulent discharge, fever.  Wash towels, washcloths, sheets in hot water with bleach every couple of days until infection resolved.Take antibiotic as ordered.  Tylenol 1000mg  po QID prn pain cryotherapy 15 minutes QID.  Hydrocodone 5mg  po TID prn severe pain # 6 RF0 Avoid driving probable sedation/hydrate.  Work excuse not required off x 48 hours. Salt water gargles po prn.  Good dental hygiene brushing, flossing, mouthwash BID.  Follow up  with PCM/dentist if no improvement or worsening of symptoms e.g. fever, chills, dyspnea, SOB, purulent discharge, neck pain, eye pain.  ER this weekend if dyspnea/dysphagia/dysphasia/drooling.    Patient verbalized understanding, agreed with plan of care and had no further questions at this time.      Barbaraann Barthel, NP 06/13/16 1521    Barbaraann Barthel, NP 06/13/16 1523

## 2016-06-15 DIAGNOSIS — L0201 Cutaneous abscess of face: Secondary | ICD-10-CM | POA: Diagnosis not present

## 2016-06-15 DIAGNOSIS — L0291 Cutaneous abscess, unspecified: Secondary | ICD-10-CM | POA: Diagnosis not present

## 2016-06-16 ENCOUNTER — Telehealth: Payer: Self-pay

## 2016-06-16 NOTE — Telephone Encounter (Signed)
Courtesy call back completed today for patient's recent visit at [facility]. Patient did not answer, left message on machine to call back with any questions or concerns.   

## 2016-07-13 DIAGNOSIS — Z789 Other specified health status: Secondary | ICD-10-CM | POA: Diagnosis not present

## 2016-08-31 DIAGNOSIS — Z0000 Encounter for general adult medical examination without abnormal findings: Secondary | ICD-10-CM | POA: Diagnosis not present

## 2016-11-13 DIAGNOSIS — J028 Acute pharyngitis due to other specified organisms: Secondary | ICD-10-CM | POA: Diagnosis not present

## 2016-12-01 DIAGNOSIS — H00022 Hordeolum internum right lower eyelid: Secondary | ICD-10-CM | POA: Diagnosis not present

## 2016-12-02 DIAGNOSIS — H0012 Chalazion right lower eyelid: Secondary | ICD-10-CM | POA: Diagnosis not present

## 2017-03-29 DIAGNOSIS — M25571 Pain in right ankle and joints of right foot: Secondary | ICD-10-CM | POA: Diagnosis not present

## 2017-06-15 ENCOUNTER — Ambulatory Visit: Payer: Self-pay | Admitting: Obstetrics and Gynecology

## 2017-06-18 DIAGNOSIS — Z23 Encounter for immunization: Secondary | ICD-10-CM | POA: Diagnosis not present

## 2018-01-14 ENCOUNTER — Ambulatory Visit (INDEPENDENT_AMBULATORY_CARE_PROVIDER_SITE_OTHER): Payer: BLUE CROSS/BLUE SHIELD | Admitting: Obstetrics and Gynecology

## 2018-01-14 ENCOUNTER — Encounter: Payer: Self-pay | Admitting: Obstetrics and Gynecology

## 2018-01-14 ENCOUNTER — Ambulatory Visit: Payer: Self-pay | Admitting: Obstetrics and Gynecology

## 2018-01-14 VITALS — BP 118/70 | HR 103 | Ht 64.0 in | Wt 141.0 lb

## 2018-01-14 DIAGNOSIS — Z1231 Encounter for screening mammogram for malignant neoplasm of breast: Secondary | ICD-10-CM

## 2018-01-14 DIAGNOSIS — Z3169 Encounter for other general counseling and advice on procreation: Secondary | ICD-10-CM | POA: Diagnosis not present

## 2018-01-14 DIAGNOSIS — Z01419 Encounter for gynecological examination (general) (routine) without abnormal findings: Secondary | ICD-10-CM

## 2018-01-14 DIAGNOSIS — Z124 Encounter for screening for malignant neoplasm of cervix: Secondary | ICD-10-CM

## 2018-01-14 DIAGNOSIS — Z1239 Encounter for other screening for malignant neoplasm of breast: Secondary | ICD-10-CM

## 2018-01-14 DIAGNOSIS — Z0000 Encounter for general adult medical examination without abnormal findings: Secondary | ICD-10-CM | POA: Diagnosis not present

## 2018-01-14 NOTE — Patient Instructions (Signed)
Mammogram A mammogram is an X-ray of the breasts that is done to check for abnormal changes. This procedure can screen for and detect any changes that may suggest breast cancer. A mammogram can also identify other changes and variations in the breast, such as:  Inflammation of the breast tissue (mastitis).  An infected area that contains a collection of pus (abscess).  A fluid-filled sac (cyst).  Fibrocystic changes. This is when breast tissue becomes denser, which can make the tissue feel rope-like or uneven under the skin.  Tumors that are not cancerous (benign).  Tell a health care provider about:  Any allergies you have.  If you have breast implants.  If you have had previous breast disease, biopsy, or surgery.  If you are breastfeeding.  Any possibility that you could be pregnant, if this applies.  If you are younger than age 25.  If you have a family history of breast cancer. What are the risks? Generally, this is a safe procedure. However, problems may occur, including:  Exposure to radiation. Radiation levels are very low with this test.  The results being misinterpreted.  The need for further tests.  The inability of the mammogram to detect certain cancers.  What happens before the procedure?  Schedule your test about 1-2 weeks after your menstrual period. This is usually when your breasts are the least tender.  If you have had a mammogram done at a different facility in the past, get the mammogram X-rays or have them sent to your current exam facility in order to compare them.  Wash your breasts and under your arms the day of the test.  Do not wear deodorants, perfumes, lotions, or powders anywhere on your body on the day of the test.  Remove any jewelry from your neck.  Wear clothes that you can change into and out of easily. What happens during the procedure?  You will undress from the waist up and put on a gown.  You will stand in front of the  X-ray machine.  Each breast will be placed between two plastic or glass plates. The plates will compress your breast for a few seconds. Try to stay as relaxed as possible during the procedure. This does not cause any harm to your breasts and any discomfort you feel will be very brief.  X-rays will be taken from different angles of each breast. The procedure may vary among health care providers and hospitals. What happens after the procedure?  The mammogram will be examined by a specialist (radiologist).  You may need to repeat certain parts of the test, depending on the quality of the images. This is commonly done if the radiologist needs a better view of the breast tissue.  Ask when your test results will be ready. Make sure you get your test results.  You may resume your normal activities. This information is not intended to replace advice given to you by your health care provider. Make sure you discuss any questions you have with your health care provider. Document Released: 08/07/2000 Document Revised: 01/13/2016 Document Reviewed: 10/19/2014 [employer] Interactive Patient Education  2018 [employer]  

## 2018-01-14 NOTE — Progress Notes (Signed)
Gynecology Annual Exam   PCP: Creola Corn, MD  Chief Complaint:  Chief Complaint  Patient presents with  . Gynecologic Exam    History of Present Illness: Patient is a 42 y.o. No obstetric history on file. presents for annual exam. The patient has no complaints today.   LMP: Patient's last menstrual period was 01/05/2018. Average Interval: regular, 28 days Duration of flow: 5 days Heavy Menses: no Clots: no Intermenstrual Bleeding: no Postcoital Bleeding: no Dysmenorrhea: no  The patient is sexually active. She currently uses none for contraception. She denies dyspareunia.  The patient sporadically performs self breast exams.  There is no notable family history of breast or ovarian cancer in her family.  The patient wears seatbelts: yes.   The patient has regular exercise: not asked.    The patient denies current symptoms of depression.    Review of Systems: Review of Systems  Constitutional: Negative for chills and fever.  HENT: Negative for congestion.   Respiratory: Negative for cough and shortness of breath.   Cardiovascular: Negative for chest pain and palpitations.  Gastrointestinal: Negative for abdominal pain, constipation, diarrhea, heartburn, nausea and vomiting.  Genitourinary: Negative for dysuria, frequency and urgency.  Skin: Negative for itching and rash.  Neurological: Negative for dizziness and headaches.  Endo/Heme/Allergies: Negative for polydipsia.  Psychiatric/Behavioral: Negative for depression.    Past Medical History:  Past Medical History:  Diagnosis Date  . IBS (irritable bowel syndrome)     Past Surgical History:  Past Surgical History:  Procedure Laterality Date  . CESAREAN SECTION  2013    Gynecologic History:  Patient's last menstrual period was 01/05/2018. Contraception: none Last Pap: Results were:06/12/2016 NIL and HR HPV negative  08/15/2015 CIN II-III positive endocervical margin and negative ECC 06/26/2015 HSIL HPV  positive  Obstetric History: No obstetric history on file.  Family History:  History reviewed. No pertinent family history.  Social History:  Social History   Socioeconomic History  . Marital status: Single    Spouse name: Not on file  . Number of children: Not on file  . Years of education: Not on file  . Highest education level: Not on file  Occupational History  . Not on file  Social Needs  . Financial resource strain: Not on file  . Food insecurity:    Worry: Not on file    Inability: Not on file  . Transportation needs:    Medical: Not on file    Non-medical: Not on file  Tobacco Use  . Smoking status: Never Smoker  . Smokeless tobacco: Never Used  Substance and Sexual Activity  . Alcohol use: Yes  . Drug use: No  . Sexual activity: Yes    Birth control/protection: None  Lifestyle  . Physical activity:    Days per week: 0 days    Minutes per session: 0 min  . Stress: Not at all  Relationships  . Social connections:    Talks on phone: Not on file    Gets together: Not on file    Attends religious service: Not on file    Active member of club or organization: Not on file    Attends meetings of clubs or organizations: Not on file    Relationship status: Not on file  . Intimate partner violence:    Fear of current or ex partner: Not on file    Emotionally abused: Not on file    Physically abused: Not on file    Forced  sexual activity: Not on file  Other Topics Concern  . Not on file  Social History Narrative  . Not on file    Allergies:  No Known Allergies  Medications: Prior to Admission medications   Not on File    Physical Exam Blood pressure 118/70, pulse (!) 103, height  (1.626 m), weight 141 lb (64 kg), last menstrual period 01/05/2018. Body mass index is 24.2 kg/m.  General: NAD HEENT: normocephalic, anicteric Thyroid: no enlargement, no palpable nodules Pulmonary: No increased work of breathing, CTAB Cardiovascular: RRR, distal  pulses 2+ Breast: Breast symmetrical, no tenderness, no palpable nodules or masses, no skin or nipple retraction present, no nipple discharge.  No axillary or supraclavicular lymphadenopathy. Abdomen: NABS, soft, non-tender, non-distended.  Umbilicus without lesions.  No hepatomegaly, splenomegaly or masses palpable. No evidence of hernia  Genitourinary:  External: Normal external female genitalia.  Normal urethral meatus, normal Bartholin's and Skene's glands.    Vagina: Normal vaginal mucosa, no evidence of prolapse.    Cervix: Grossly normal in appearance, no bleeding  Uterus: Non-enlarged, mobile, normal contour.  No CMT  Adnexa: ovaries non-enlarged, no adnexal masses  Rectal: deferred  Lymphatic: no evidence of inguinal lymphadenopathy Extremities: no edema, erythema, or tenderness Neurologic: Grossly intact Psychiatric: mood appropriate, affect full  Female chaperone present for pelvic and breast  portions of the physical exam    Assessment: 42 y.o. No obstetric history on file. routine annual exam  Plan: Problem List Items Addressed This Visit    None    Visit Diagnoses    Encounter for gynecological examination without abnormal finding    -  Primary   Relevant Orders   PapIG, HPV, rfx 16/18   TSH+Prl+FSH+TestT+LH+DHEA S...   Anti mullerian hormone   Screening for malignant neoplasm of cervix       Relevant Orders   PapIG, HPV, rfx 16/18   Breast screening       Relevant Orders   MM DIGITAL SCREENING BILATERAL   Encounter for preconception consultation       Relevant Orders   TSH+Prl+FSH+TestT+LH+DHEA S...   Anti mullerian hormone   Infertility counseling       Relevant Orders   TSH+Prl+FSH+TestT+LH+DHEA S...   Anti mullerian hormone      2) STI screening  was notoffered and therefore not obtained  2)  ASCCP guidelines and rational discussed.  Patient opts for yearly screening interval  3) Contraception - the patient is currently using  none.  She is  attempting to conceive in the near future.  We discussed the underlying etiologies which may be implicated in a couple experiencing difficulty conceiving.  The average couple will conceive within the span of 1 year with unprotected coitus, with a monthly fecundity rate of 20% or 1 in 5.  Even without further work up or intervention the patient and her partner may be successful in conceiving unassisted, although if an underlying etiology can be identified and addressed fecundity rate may improve.  The work up entails examining for ovulatory function, tubal patency, and ruling out female factor infertility.  These may be looked at concurrently or sequentially.  The downside of sequential work up is that this method may miss issues if more than one compartment is contributing.  She is aware that tubal factor or moderate to severe female factor infertility will require further consultation with a reproductive endocrinologist.  In the case of anovulation, use of Clomid (clomiphen citrate) or Femara (letrazole) were discussed with the understanding  the the later is an off-label, but well supported use.  Current recommendation are to use letrozole first line secondary to increased live birth rate compared to clomid for patients with PCOS ("Polycystic Ovarian Syndrome" ACOG Practice Bulletin 194 June 2018).  With either of these drugs the risk of multiples increases from the standard population rate of 2% to approximately 10%, with higher order multiples possible but unlikely.  Both drugs may require some time to titrate to the appropriate dosage to ensure consistent ovulation.  Cycles will be limited to 6 cycles on each drug secondary to decreasing rates of conception after 6 cycles.  In addition should patient be started on ovulation induction with Clomid she was advised to discontinue the drug for any vision changes as this is a rare but potentially permanent side-effect if medication is continued.  Was counseled that  letrozole may cause idiopathic bone pain that generally resolves.  Hot flashes, headaches, and nausea were mentioned as the most commonly encountered side-effects of both drugs.  We discussed timing of intercourse as well as the use of ovulation predictor kits identify the patient's fertile window each month.    - has not used contraception last 3 years - ovulatory by history, has not done OPK's - will obtain basic infertility labs and semen analysis   4) Routine healthcare maintenance including cholesterol, diabetes screening discussed managed by PCP  5) Return in about 1 year (around 01/15/2019) for annual.   Vena Austria, MD, [facility] 01/14/2018, 9:02 AM

## 2018-01-20 ENCOUNTER — Encounter (INDEPENDENT_AMBULATORY_CARE_PROVIDER_SITE_OTHER): Payer: Self-pay

## 2018-01-20 ENCOUNTER — Encounter: Payer: Self-pay | Admitting: Obstetrics and Gynecology

## 2018-01-20 LAB — PAPIG, HPV, RFX 16/18
HPV, high-risk: NEGATIVE
PAP Smear Comment: 0

## 2018-01-22 LAB — TSH+PRL+FSH+TESTT+LH+DHEA S...
17-Hydroxyprogesterone: 131 ng/dL
Androstenedione: 159 ng/dL (ref 41–262)
DHEA-SO4: 317.9 ug/dL — ABNORMAL HIGH (ref 57.3–279.2)
FSH: 8.5 m[IU]/mL
LH: 22 m[IU]/mL
Prolactin: 19 ng/mL (ref 4.8–23.3)
TSH: 1.54 u[IU]/mL (ref 0.450–4.500)
Testosterone, Free: 3.7 pg/mL (ref 0.0–4.2)
Testosterone: 41 ng/dL (ref 8–48)

## 2018-01-22 LAB — ANTI MULLERIAN HORMONE: ANTI-MULLERIAN HORMONE (AMH): 3.26 ng/mL

## 2018-01-24 ENCOUNTER — Encounter (INDEPENDENT_AMBULATORY_CARE_PROVIDER_SITE_OTHER): Payer: Self-pay

## 2018-01-26 ENCOUNTER — Other Ambulatory Visit: Payer: Self-pay | Admitting: Obstetrics and Gynecology

## 2018-01-26 ENCOUNTER — Telehealth: Payer: Self-pay | Admitting: Obstetrics and Gynecology

## 2018-01-26 DIAGNOSIS — N970 Female infertility associated with anovulation: Secondary | ICD-10-CM

## 2018-01-26 MED ORDER — LETROZOLE 2.5 MG PO TABS: 2.5000 mg | ORAL_TABLET | Freq: Every day | ORAL | 0 refills | Status: AC

## 2018-01-26 NOTE — Telephone Encounter (Signed)
Done

## 2018-01-26 NOTE — Progress Notes (Signed)
LMP 01/24/18 Cycle I letrozole 2.5mg  day 21 progesterone 02/14/18

## 2018-01-26 NOTE — Telephone Encounter (Signed)
Patient wishes to have labs drawn at her work. [phone]. Attn to Bed Bath & BeyondKelly

## 2018-02-14 ENCOUNTER — Encounter: Payer: Self-pay | Admitting: Obstetrics and Gynecology

## 2018-02-14 DIAGNOSIS — N970 Female infertility associated with anovulation: Secondary | ICD-10-CM | POA: Diagnosis not present

## 2018-02-16 ENCOUNTER — Encounter (INDEPENDENT_AMBULATORY_CARE_PROVIDER_SITE_OTHER): Payer: Self-pay

## 2018-02-16 ENCOUNTER — Encounter: Payer: Self-pay | Admitting: Obstetrics and Gynecology

## 2018-02-23 ENCOUNTER — Other Ambulatory Visit: Payer: Self-pay | Admitting: Obstetrics & Gynecology

## 2018-02-23 ENCOUNTER — Encounter: Payer: Self-pay | Admitting: Obstetrics and Gynecology

## 2018-02-23 DIAGNOSIS — E3452 Partial androgen insensitivity syndrome: Secondary | ICD-10-CM

## 2018-02-23 DIAGNOSIS — N979 Female infertility, unspecified: Secondary | ICD-10-CM

## 2018-02-23 MED ORDER — LETROZOLE 2.5 MG PO TABS: 2.5000 mg | ORAL_TABLET | Freq: Every day | ORAL | 0 refills | Status: DC

## 2018-02-23 NOTE — Telephone Encounter (Signed)
ERx (Letrozole 2.5 mg daily for 5 days starting today) is done.  Lab appt needed Jul 22 (order in).  Let her know Thx PH

## 2018-03-21 ENCOUNTER — Encounter: Payer: Self-pay | Admitting: Obstetrics and Gynecology

## 2018-03-21 ENCOUNTER — Other Ambulatory Visit: Payer: Self-pay | Admitting: Obstetrics and Gynecology

## 2018-03-21 DIAGNOSIS — N970 Female infertility associated with anovulation: Secondary | ICD-10-CM

## 2018-03-21 MED ORDER — LETROZOLE 2.5 MG PO TABS: 2.5000 mg | ORAL_TABLET | Freq: Every day | ORAL | 0 refills | Status: AC

## 2018-03-21 NOTE — Progress Notes (Signed)
Letrozole cycle 2.5mg  cycle III day 21 progesterone

## 2018-03-22 ENCOUNTER — Telehealth: Payer: Self-pay | Admitting: Obstetrics and Gynecology

## 2018-03-22 NOTE — Telephone Encounter (Signed)
-----   Message from Vena AustriaAndreas Staebler, MD sent at 03/21/2018  5:00 PM EDT ----- Regarding: Lab Progesterone lab 04/11/18

## 2018-03-22 NOTE — Telephone Encounter (Signed)
Attempt to reach patient to schedule lab appointment. Patient voicemail box is full unable to leave message

## 2018-04-07 ENCOUNTER — Telehealth: Payer: Self-pay

## 2018-04-07 NOTE — Telephone Encounter (Signed)
Pt states she has a lab apt 04/11/18 but she gets her labs done thru her work. She will fax her results when she receives them. Pt requests her lab apt be cancelled.

## 2018-04-07 NOTE — Telephone Encounter (Signed)
Apt cancelled per patient request

## 2018-04-11 ENCOUNTER — Encounter: Payer: Self-pay | Admitting: Obstetrics and Gynecology

## 2018-04-11 ENCOUNTER — Other Ambulatory Visit: Payer: BLUE CROSS/BLUE SHIELD

## 2018-04-11 DIAGNOSIS — N970 Female infertility associated with anovulation: Secondary | ICD-10-CM | POA: Diagnosis not present

## 2018-04-14 ENCOUNTER — Telehealth: Payer: Self-pay

## 2018-04-14 NOTE — Telephone Encounter (Signed)
Please advise 

## 2018-04-14 NOTE — Telephone Encounter (Signed)
Pt faxed her lab results to us on the 20th.  Wants to know if we received them and if her medication dose needs to be changed.  [phone]

## 2018-04-15 NOTE — Telephone Encounter (Signed)
Pt calling again.  Her progesterone level is 3.6; she started her cycle last evening; does her medication dose need to be increased?  She can fax results if need be.  [phone]

## 2018-04-15 NOTE — Telephone Encounter (Signed)
Patient is calling to speak with a nurse.    Please advise

## 2018-04-15 NOTE — Telephone Encounter (Signed)
I think that this is about letrazole, but I'm not sure.  -Salim Forero

## 2018-04-15 NOTE — Telephone Encounter (Signed)
Unable to leave voicemail d/t mailbox is full

## 2018-04-15 NOTE — Telephone Encounter (Signed)
Angela Pattenonya, do you remember seeing a fax on this patient. If not, can you call her and get her to refax or get the name of where she had it drawn and maybe we can get a copy. Thanks

## 2018-04-15 NOTE — Telephone Encounter (Signed)
I do not recall seeing any outside lab results

## 2018-04-18 ENCOUNTER — Other Ambulatory Visit: Payer: Self-pay | Admitting: Obstetrics and Gynecology

## 2018-04-18 DIAGNOSIS — N970 Female infertility associated with anovulation: Secondary | ICD-10-CM

## 2018-04-18 MED ORDER — LETROZOLE 2.5 MG PO TABS: 2.5000 mg | ORAL_TABLET | Freq: Every day | ORAL | 0 refills | Status: AC

## 2018-04-18 MED ORDER — LETROZOLE 2.5 MG PO TABS: 2.5000 mg | ORAL_TABLET | Freq: Every day | ORAL | 0 refills | Status: DC

## 2018-04-18 NOTE — Progress Notes (Signed)
LMP 04/15/18 letrozole 2.5 mg

## 2018-04-18 NOTE — Telephone Encounter (Signed)
Labs on 06/05/18

## 2018-04-18 NOTE — Progress Notes (Signed)
Please resend

## 2018-06-06 DIAGNOSIS — Z23 Encounter for immunization: Secondary | ICD-10-CM | POA: Diagnosis not present

## 2019-03-08 DIAGNOSIS — Z20818 Contact with and (suspected) exposure to other bacterial communicable diseases: Secondary | ICD-10-CM | POA: Diagnosis not present

## 2019-05-31 ENCOUNTER — Other Ambulatory Visit: Payer: Self-pay | Admitting: *Deleted

## 2019-05-31 DIAGNOSIS — Z20822 Contact with and (suspected) exposure to covid-19: Secondary | ICD-10-CM

## 2019-06-02 LAB — NOVEL CORONAVIRUS, NAA: SARS-CoV-2, NAA: NOT DETECTED

## 2019-08-02 ENCOUNTER — Other Ambulatory Visit: Payer: Self-pay | Admitting: *Deleted

## 2019-08-02 DIAGNOSIS — Z20822 Contact with and (suspected) exposure to covid-19: Secondary | ICD-10-CM

## 2019-08-04 LAB — NOVEL CORONAVIRUS, NAA: SARS-CoV-2, NAA: NOT DETECTED

## 2019-12-14 DIAGNOSIS — J029 Acute pharyngitis, unspecified: Secondary | ICD-10-CM | POA: Diagnosis not present

## 2020-01-25 ENCOUNTER — Ambulatory Visit (INDEPENDENT_AMBULATORY_CARE_PROVIDER_SITE_OTHER): Payer: BC Managed Care – PPO

## 2020-01-25 ENCOUNTER — Other Ambulatory Visit: Payer: Self-pay

## 2020-01-25 ENCOUNTER — Encounter: Payer: Self-pay | Admitting: Emergency Medicine

## 2020-01-25 ENCOUNTER — Ambulatory Visit
Admission: EM | Admit: 2020-01-25 | Discharge: 2020-01-25 | Disposition: A | Discharge status: Home / Self Care | Payer: BC Managed Care – PPO | Attending: Family Medicine | Admitting: Family Medicine

## 2020-01-25 DIAGNOSIS — S59912A Unspecified injury of left forearm, initial encounter: Secondary | ICD-10-CM | POA: Diagnosis not present

## 2020-01-25 DIAGNOSIS — M545 Low back pain, unspecified: Secondary | ICD-10-CM

## 2020-01-25 DIAGNOSIS — S5012XA Contusion of left forearm, initial encounter: Secondary | ICD-10-CM | POA: Diagnosis not present

## 2020-01-25 DIAGNOSIS — S3992XA Unspecified injury of lower back, initial encounter: Secondary | ICD-10-CM | POA: Diagnosis not present

## 2020-01-25 DIAGNOSIS — S59902A Unspecified injury of left elbow, initial encounter: Secondary | ICD-10-CM | POA: Diagnosis not present

## 2020-01-25 MED ORDER — KETOROLAC TROMETHAMINE 60 MG/2ML IM SOLN
60.0000 mg | Freq: Once | INTRAMUSCULAR | Status: AC
  Administered 2020-01-25: 60 mg via INTRAMUSCULAR

## 2020-01-25 MED ORDER — NAPROXEN 500 MG PO TABS: 500.0000 mg | ORAL_TABLET | Freq: Two times a day (BID) | ORAL | 0 refills | Status: AC | PRN

## 2020-01-25 MED ORDER — TRAMADOL HCL 50 MG PO TABS: 50.0000 mg | ORAL_TABLET | Freq: Three times a day (TID) | ORAL | 0 refills | Status: AC | PRN

## 2020-01-25 NOTE — ED Triage Notes (Signed)
Patient c/o falling down her stairs this morning injuring her left arm and back. Patient has swelling and bruising to her left arm.

## 2020-01-25 NOTE — ED Provider Notes (Signed)
[facility]    CSN: [id number] Arrival date & time: 01/25/20  0856      History   Chief Complaint Chief Complaint  Patient presents with  . Arm Pain   HPI  44 year old female presents for evaluation after suffering a fall.  Patient reports that she was going to work and slipped.  She states that she fell down 4 stairs.  She reports that she injured her left arm, low back, and bottom.  She has significant swelling and pain of the left arm (forearm).  Patient also has an abrasion to the low back.  Rates her pain as 8/10 in severity.  No relieving factors.  Pain exacerbated by activity and palpation.  No head injury.  No loss of conscious.  No other associated symptoms.  No other complaints.  Past Medical History:  Diagnosis Date  . IBS (irritable bowel syndrome)    Past Surgical History:  Procedure Laterality Date  . CESAREAN SECTION  2013    OB History    Gravida  2   Para  1   Term      Preterm      AB  1   Living  1     SAB  1   TAB      Ectopic      Multiple      Live Births             Home Medications    Prior to Admission medications   Medication Sig Start Date End Date Taking? Authorizing Provider  linaclotide (LINZESS) 72 MCG capsule Take 72 mcg by mouth daily before breakfast.    [provider]  naproxen (NAPROSYN) 500 MG tablet Take 1 tablet (500 mg total) by mouth 2 (two) times daily as needed for moderate pain. 01/25/20   Coral Spikes, DO  omeprazole (PRILOSEC) 20 MG capsule Take by mouth daily. 11/04/17   [provider]  traMADol (ULTRAM) 50 MG tablet Take 1 tablet (50 mg total) by mouth every 8 (eight) hours as needed. 01/25/20   Coral Spikes, DO   Social History Social History   Tobacco Use  . Smoking status: Never Smoker  . Smokeless tobacco: Never Used  Substance Use Topics  . Alcohol use: Yes  . Drug use: No     Allergies   Patient has no known allergies.   Review of Systems Review of  Systems  Musculoskeletal: Positive for back pain.       Left arm pain.  Neurological: Negative for syncope.   Physical Exam Triage Vital Signs ED Triage Vitals  Enc Vitals Group     BP 01/25/20 0916 (!) 155/134     Pulse Rate 01/25/20 0916 98     Resp 01/25/20 0916 18     Temp 01/25/20 0916 97.8 F (36.6 C)     Temp Source 01/25/20 0916 Oral     SpO2 01/25/20 0916 100 %     Weight 01/25/20 0913 150 lb (68 kg)     Height 01/25/20 0913 5\' 4"  (1.626 m)     Head Circumference --      Peak Flow --      Pain Score 01/25/20 0913 8     Pain Loc --      Pain Edu? --      Excl. in [place]? --    Updated Vital Signs BP (!) 155/134 (BP Location: Right Arm)   Pulse 98   Temp 97.8 F (  36.6 C) (Oral)   Resp 18   Ht 5\' 4"  (1.626 m)   Wt 68 kg   LMP 01/16/2020 Comment: pt denies preg, signed waiver  SpO2 100%   BMI 25.75 kg/m   Visual Acuity Right Eye Distance:   Left Eye Distance:   Bilateral Distance:    Right Eye Near:   Left Eye Near:    Bilateral Near:     Physical Exam Vitals and nursing note reviewed.  Constitutional:      Comments: Patient appears in distress secondary to pain.  Patient is tearful.  HENT:     Head: Normocephalic and atraumatic.  Eyes:     General:        Right eye: No discharge.        Left eye: No discharge.     Conjunctiva/sclera: Conjunctivae normal.  Cardiovascular:     Rate and Rhythm: Normal rate and regular rhythm.     Heart sounds: No murmur.  Pulmonary:     Effort: Pulmonary effort is normal.     Breath sounds: Normal breath sounds. No wheezing, rhonchi or rales.  Musculoskeletal:     Comments: No tenderness or swelling of the left wrist and hand.   Left forearm with a large hematoma.  Very tender to palpation.  Exam limited due to pain.  No tenderness to palpation of the left shoulder.  Skin:    Comments: Hematoma noted to the left forearm.  Abrasion noted on the low back.  Neurological:     Mental Status: She is alert.    Psychiatric:        Behavior: Behavior normal.     Comments: Tearful.    UC Treatments / Results  Labs (all labs ordered are listed, but only abnormal results are displayed) Labs Reviewed - No data to display  EKG   Radiology DG Lumbar Spine Complete  Result Date: 01/25/2020 CLINICAL DATA:  Pain following fall EXAM: LUMBAR SPINE - COMPLETE 4+ VIEW COMPARISON:  None. FINDINGS: Frontal, lateral, spot lumbosacral lateral, and bilateral oblique views were obtained. There are 5 non-rib-bearing lumbar type vertebral bodies. There is lower lumbar levoscoliosis. There is no fracture or spondylolisthesis. The disc spaces appear normal. There is no appreciable facet arthropathy. Calcification in the mid pelvis may represent calcified uterine leiomyomatous change. IMPRESSION: Mild scoliosis. No fracture or spondylolisthesis. No evident arthropathy. Suspect uterine leiomyomatous change. Electronically Signed   By: 03/26/2020 III M.D.   On: 01/25/2020 10:19   DG Elbow Complete Left  Result Date: 01/25/2020 CLINICAL DATA:  Pain following fall EXAM: LEFT ELBOW - COMPLETE 3+ VIEW COMPARISON:  None. FINDINGS: Frontal, lateral, and bilateral oblique views were obtained. No evident fracture or dislocation. No joint effusion. Joint spaces appear normal. No erosive change. IMPRESSION: No fracture or dislocation.  No evident arthropathy. Electronically Signed   By: 03/26/2020 III M.D.   On: 01/25/2020 10:17   DG Forearm Left  Result Date: 01/25/2020 CLINICAL DATA:  Pain following fall EXAM: LEFT FOREARM - 2 VIEW COMPARISON:  None. FINDINGS: Frontal and lateral views were obtained. No fracture or dislocation. Joint spaces appear normal. No erosive change. There is a minus ulnar variance. IMPRESSION: No fracture or dislocation. No evident arthropathy. There is a minus ulnar variance. Electronically Signed   By: 03/26/2020 III M.D.   On: 01/25/2020 10:18    Procedures Procedures (including  critical care time)  Medications Ordered in UC Medications  ketorolac (TORADOL) injection 60 mg (60 mg  Intramuscular Given 01/25/20 0933)    Initial Impression / Assessment and Plan / UC Course  I have reviewed the triage vital signs and the nursing notes.  Pertinent labs & imaging results that were available during my care of the patient were reviewed by me and considered in my medical decision making (see chart for details).    44 year old female presents for evaluation after suffering a fall.  X-rays obtained today and independently reviewed by me.  Interpretation: No acute fractures of the elbow, forearm, or lumbar spine.  Tramadol and naproxen as directed.  Supportive care.  Final Clinical Impressions(s) / UC Diagnoses   Final diagnoses:  Traumatic hematoma of left forearm, initial encounter  Acute low back pain without sciatica, unspecified back pain laterality     Discharge Instructions     Rest, ice.  Medications as prescribed.  Take care  Dr. Adriana Simas     ED Prescriptions    Medication Sig Dispense Auth. Provider   traMADol (ULTRAM) 50 MG tablet Take 1 tablet (50 mg total) by mouth every 8 (eight) hours as needed. 10 tablet Hopelynn Gartland G, DO   naproxen (NAPROSYN) 500 MG tablet Take 1 tablet (500 mg total) by mouth 2 (two) times daily as needed for moderate pain. 30 tablet Everlene Other G, DO     I have reviewed the PDMP during this encounter.   Tommie Sams, DO 01/25/20 1030

## 2020-01-25 NOTE — Discharge Instructions (Signed)
Rest, ice.  Medications as prescribed.  Take care  Dr. Adriana Simas

## 2020-04-08 DIAGNOSIS — Z20828 Contact with and (suspected) exposure to other viral communicable diseases: Secondary | ICD-10-CM | POA: Diagnosis not present

## 2020-04-08 DIAGNOSIS — Z20822 Contact with and (suspected) exposure to covid-19: Secondary | ICD-10-CM | POA: Diagnosis not present

## 2020-05-14 DIAGNOSIS — Z20822 Contact with and (suspected) exposure to covid-19: Secondary | ICD-10-CM | POA: Diagnosis not present

## 2020-05-14 DIAGNOSIS — J029 Acute pharyngitis, unspecified: Secondary | ICD-10-CM | POA: Diagnosis not present

## 2020-07-30 IMAGING — CR DG FOREARM 2V*L*
2 series · 2 of 2 positions shown · non-contrast
Comparison: None.

CLINICAL DATA: Pain following fall

EXAM:
LEFT FOREARM - 2 VIEW

[forearm ap]
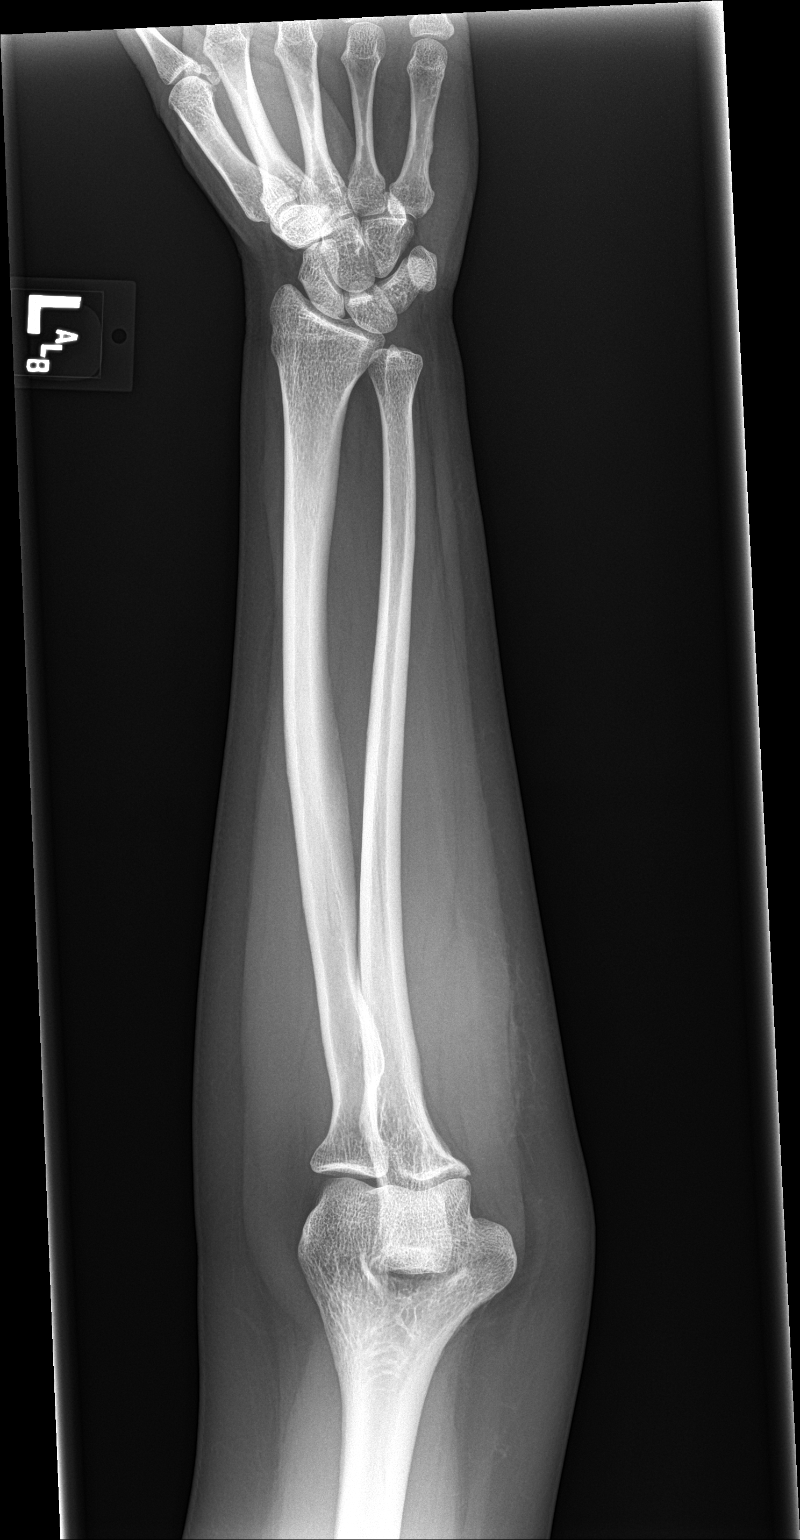

[forearm lat]
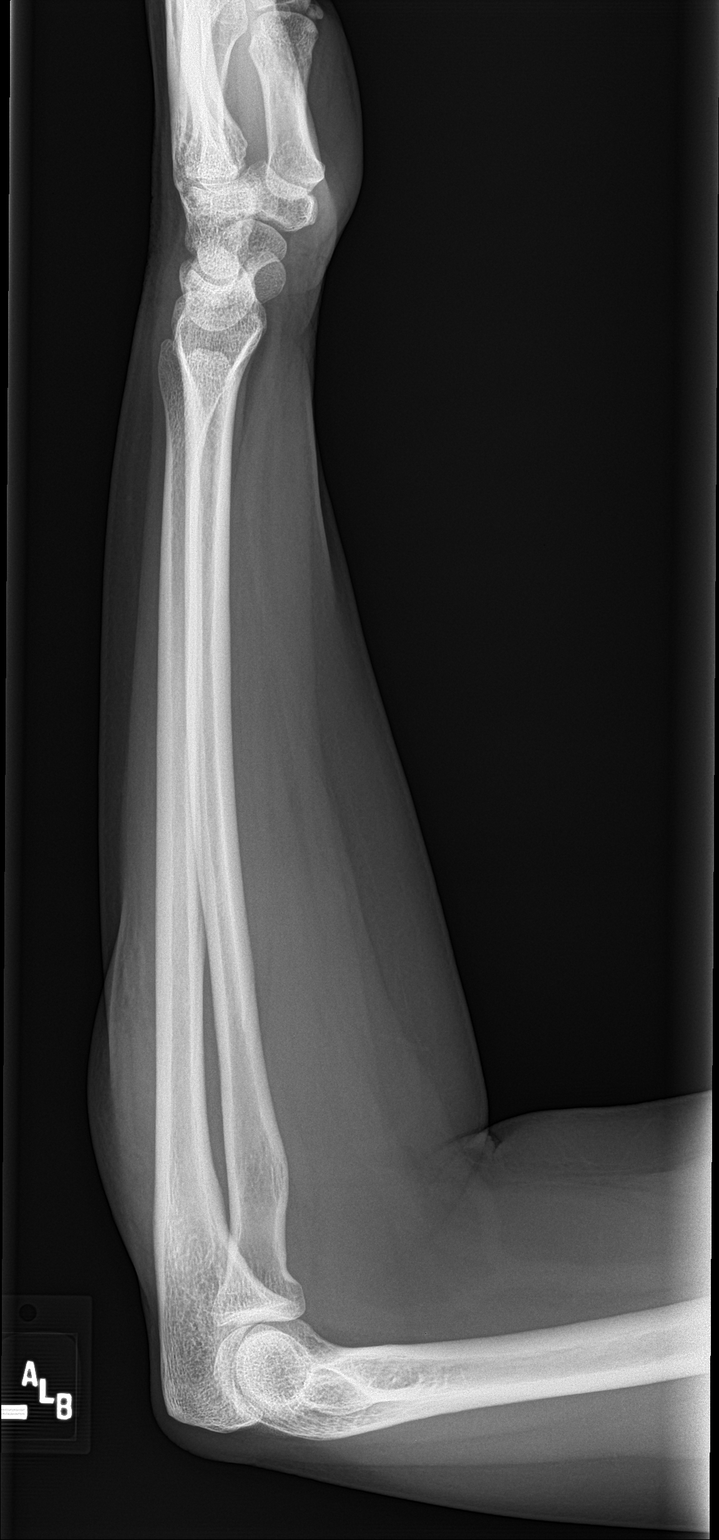

[2 of 2 positions shown; findings below may reference images not displayed]

FINDINGS: Frontal and lateral views were obtained. No fracture or dislocation.
Joint spaces appear normal. No erosive change. There is a minus
ulnar variance.
IMPRESSION: No fracture or dislocation. No evident arthropathy. There is a minus
ulnar variance.

## 2021-06-12 ENCOUNTER — Ambulatory Visit: Payer: BLUE CROSS/BLUE SHIELD | Admitting: Obstetrics and Gynecology

## 2021-12-24 NOTE — Progress Notes (Deleted)
   PCP:  Creola Corn, MD   No chief complaint on file.    HPI:      [name] is a 46 y.o. G2P0011 whose LMP was No LMP recorded., presents today for her annual examination.  Her menses are {norm/abn:715}, lasting {number: 22536} days.  Dysmenorrhea {dysmen:716}. She {does:18564} have intermenstrual bleeding.  Sex activity: {sex active: 315163}.  Last Pap: 01/14/18 Results were: no abnormalities /neg HPV DNA  Hx of STDs: {STD hx:14358}  Last mammogram: {date:304500300}  Results were: {norm/abn:13465} There is no FH of breast cancer. There is no FH of ovarian cancer. The patient {does:18564} do self-breast exams.  Tobacco use: {tob:20664} Alcohol use: {Alcohol:11675} No drug use.  Exercise: {exercise:31265}  She {does:18564} get adequate calcium and Vitamin D in her diet.  There are no problems to display for this patient.   Past Surgical History:  Procedure Laterality Date   CESAREAN SECTION  2013    No family history on file.  Social History   Socioeconomic History   Marital status: Single    Spouse name: Not on file   Number of children: Not on file   Years of education: Not on file   Highest education level: Not on file  Occupational History   Not on file  Tobacco Use   Smoking status: Never   Smokeless tobacco: Never  Vaping Use   Vaping Use: Never used  Substance and Sexual Activity   Alcohol use: Yes   Drug use: No   Sexual activity: Yes    Birth control/protection: None  Other Topics Concern   Not on file  Social History Narrative   Not on file   Social Determinants of Health   Financial Resource Strain: Not on file  Food Insecurity: Not on file  Transportation Needs: Not on file  Physical Activity: Not on file  Stress: Not on file  Social Connections: Not on file  Intimate Partner Violence: Not on file     Current Outpatient Medications:    linaclotide (LINZESS) 72 MCG capsule, Take 72 mcg by mouth daily before breakfast., Disp: ,  Rfl:    naproxen (NAPROSYN) 500 MG tablet, Take 1 tablet (500 mg total) by mouth 2 (two) times daily as needed for moderate pain., Disp: 30 tablet, Rfl: 0   omeprazole (PRILOSEC) 20 MG capsule, Take by mouth daily., Disp: , Rfl: 1   traMADol (ULTRAM) 50 MG tablet, Take 1 tablet (50 mg total) by mouth every 8 (eight) hours as needed., Disp: 10 tablet, Rfl: 0     ROS:  Review of Systems BREAST: No symptoms   Objective: There were no vitals taken for this visit.   OBGyn Exam  Results: No results found for this or any previous visit (from the past 24 hour(s)).  Assessment/Plan: No diagnosis found.  No orders of the defined types were placed in this encounter.            GYN counsel {counseling: 16159}     F/U  No follow-ups on file.  Malissie Musgrave B. Tekeshia Klahr, PA-C 12/24/2021 5:18 PM

## 2021-12-25 ENCOUNTER — Other Ambulatory Visit: Payer: Self-pay | Admitting: Internal Medicine

## 2021-12-25 ENCOUNTER — Ambulatory Visit: Payer: BLUE CROSS/BLUE SHIELD | Admitting: Obstetrics and Gynecology

## 2021-12-25 DIAGNOSIS — Z1231 Encounter for screening mammogram for malignant neoplasm of breast: Secondary | ICD-10-CM

## 2021-12-25 DIAGNOSIS — Z01419 Encounter for gynecological examination (general) (routine) without abnormal findings: Secondary | ICD-10-CM

## 2021-12-26 ENCOUNTER — Inpatient Hospital Stay: Admission: RE | Admit: 2021-12-26 | Payer: BC Managed Care – PPO | Source: Ambulatory Visit

## 2022-05-28 DIAGNOSIS — K589 Irritable bowel syndrome without diarrhea: Secondary | ICD-10-CM | POA: Diagnosis not present

## 2022-05-28 DIAGNOSIS — R030 Elevated blood-pressure reading, without diagnosis of hypertension: Secondary | ICD-10-CM | POA: Diagnosis not present

## 2022-06-02 DIAGNOSIS — R030 Elevated blood-pressure reading, without diagnosis of hypertension: Secondary | ICD-10-CM | POA: Diagnosis not present

## 2022-06-02 DIAGNOSIS — Z23 Encounter for immunization: Secondary | ICD-10-CM | POA: Diagnosis not present

## 2022-06-02 DIAGNOSIS — Z0000 Encounter for general adult medical examination without abnormal findings: Secondary | ICD-10-CM | POA: Diagnosis not present

## 2022-07-31 ENCOUNTER — Ambulatory Visit
Admission: EM | Admit: 2022-07-31 | Discharge: 2022-07-31 | Disposition: A | Discharge status: Home / Self Care | Payer: BC Managed Care – PPO | Attending: Emergency Medicine | Admitting: Emergency Medicine

## 2022-07-31 ENCOUNTER — Encounter: Payer: Self-pay | Admitting: Emergency Medicine

## 2022-07-31 DIAGNOSIS — J101 Influenza due to other identified influenza virus with other respiratory manifestations: Secondary | ICD-10-CM | POA: Diagnosis not present

## 2022-07-31 DIAGNOSIS — Z20822 Contact with and (suspected) exposure to covid-19: Secondary | ICD-10-CM | POA: Diagnosis not present

## 2022-07-31 DIAGNOSIS — R059 Cough, unspecified: Secondary | ICD-10-CM | POA: Diagnosis not present

## 2022-07-31 LAB — RESP PANEL BY RT-PCR (FLU A&B, COVID) ARPGX2
Influenza A by PCR: POSITIVE — AB
Influenza B by PCR: NEGATIVE
SARS Coronavirus 2 by RT PCR: NEGATIVE

## 2022-07-31 MED ORDER — OSELTAMIVIR PHOSPHATE 75 MG PO CAPS: 75.0000 mg | ORAL_CAPSULE | Freq: Two times a day (BID) | ORAL | 0 refills | Status: AC

## 2022-07-31 MED ORDER — HYDROCODONE BIT-HOMATROP MBR 5-1.5 MG/5ML PO SOLN: 5.0000 mL | Freq: Two times a day (BID) | ORAL | 0 refills | Status: AC | PRN

## 2022-07-31 NOTE — Discharge Instructions (Addendum)
Influenza A positive, this is a virus and should steadily improve in time it can take up to 7 to 10 days before you truly start to see a turnaround however things will get better  Begin Tamiflu every morning and every evening for 5 days, reduces the amount of virus in your body which helps to minimize symptoms and timeline that you are sick, does not fully take away illness  You may use cough syrup every 12 hours as needed for additional comfort, be mindful this will make you drowsy  Please continue use of prednisone to help with chest soreness    You can take Tylenol and/or Ibuprofen as needed for fever reduction and pain relief.   For cough: honey 1/2 to 1 teaspoon (you can dilute the honey in water or another fluid).  You can also use guaifenesin and dextromethorphan for cough. You can use a humidifier for chest congestion and cough.  If you don't have a humidifier, you can sit in the bathroom with the hot shower running.      For sore throat: try warm salt water gargles, cepacol lozenges, throat spray, warm tea or water with lemon/honey, popsicles or ice, or OTC cold relief medicine for throat discomfort.   For congestion: take a daily anti-histamine like Zyrtec, Claritin, and a oral decongestant, such as pseudoephedrine.  You can also use Flonase 1-2 sprays in each nostril daily.   It is important to stay hydrated: drink plenty of fluids (water, gatorade/powerade/pedialyte, juices, or teas) to keep your throat moisturized and help further relieve irritation/discomfort.

## 2022-07-31 NOTE — ED Provider Notes (Signed)
[facility]    CSN: [id number] Arrival date & time: 07/31/22  0808      History   Chief Complaint Chief Complaint  Patient presents with   Cough   Fever   Headache    HPI Angela Mcneil is a 46 y.o. female.   Patient presents for fever, nasal congestion, rhinorrhea, and productive cough with green sputum and generalized headaches for 3 days.  No known sick contacts.  Tolerating food and liquids.  Cough is causing severe chest soreness.  Was prescribed prednisone and albuterol inhaler by PCP which has been minimally effective.  Has attempted use of Robitussin with honey which has been minimally effective.  No pertinent medical history.  Denies shortness of breath or wheezing.  Past Medical History:  Diagnosis Date   IBS (irritable bowel syndrome)     There are no problems to display for this patient.   Past Surgical History:  Procedure Laterality Date   CESAREAN SECTION  2013    OB History     Gravida  2   Para  1   Term      Preterm      AB  1   Living  1      SAB  1   IAB      Ectopic      Multiple      Live Births               Home Medications    Prior to Admission medications   Medication Sig Start Date End Date Taking? Authorizing Provider  albuterol (VENTOLIN HFA) 108 (90 Base) MCG/ACT inhaler Inhale into the lungs. 07/30/22  Yes [provider]  benzonatate (TESSALON) 200 MG capsule Take by mouth. 07/30/22  Yes [provider]  linaclotide (LINZESS) 72 MCG capsule Take 72 mcg by mouth daily before breakfast.   Yes [provider]  omeprazole (PRILOSEC) 20 MG capsule Take by mouth daily. 11/04/17  Yes [provider]  predniSONE (DELTASONE) 20 MG tablet Take 20 mg by mouth daily. 07/30/22  Yes [provider]  naproxen (NAPROSYN) 500 MG tablet Take 1 tablet (500 mg total) by mouth 2 (two) times daily as needed for moderate pain. 01/25/20   Tommie Sams, DO  traMADol (ULTRAM) 50 MG  tablet Take 1 tablet (50 mg total) by mouth every 8 (eight) hours as needed. 01/25/20   Tommie Sams, DO    Family History History reviewed. No pertinent family history.  Social History Social History   Tobacco Use   Smoking status: Never   Smokeless tobacco: Never  Vaping Use   Vaping Use: Never used  Substance Use Topics   Alcohol use: Yes   Drug use: No     Allergies   Patient has no known allergies.   Review of Systems Review of Systems  Constitutional:  Positive for fever. Negative for activity change, appetite change, chills, diaphoresis, fatigue and unexpected weight change.  HENT:  Positive for congestion and rhinorrhea. Negative for dental problem, drooling, ear discharge, ear pain, facial swelling, hearing loss, mouth sores, nosebleeds, postnasal drip, sinus pressure, sinus pain, sneezing, sore throat, tinnitus, trouble swallowing and voice change.   Respiratory:  Positive for cough. Negative for apnea, choking, chest tightness, shortness of breath, wheezing and stridor.   Neurological:  Positive for headaches. Negative for dizziness, tremors, seizures, syncope, facial asymmetry, speech difficulty, weakness, light-headedness and numbness.  All other systems reviewed and are negative.  Physical Exam Triage Vital Signs ED Triage Vitals  Enc Vitals Group     BP 07/31/22 0824 (!) 146/85     Pulse Rate 07/31/22 0824 95     Resp 07/31/22 0824 14     Temp 07/31/22 0824 98.4 F (36.9 C)     Temp Source 07/31/22 0824 Oral     SpO2 07/31/22 0824 96 %     Weight 07/31/22 0822 160 lb (72.6 kg)     Height 07/31/22 0822 5\' 4"  (1.626 m)     Head Circumference --      Peak Flow --      Pain Score 07/31/22 0821 0     Pain Loc --      Pain Edu? --      Excl. in GC? --    No data found.  Updated Vital Signs BP (!) 146/85 (BP Location: Left Arm)   Pulse 95   Temp 98.4 F (36.9 C) (Oral)   Resp 14   Ht 5\' 4"  (1.626 m)   Wt 160 lb (72.6 kg)   LMP 07/10/2022  (Approximate)   SpO2 96%   BMI 27.46 kg/m   Visual Acuity Right Eye Distance:   Left Eye Distance:   Bilateral Distance:    Right Eye Near:   Left Eye Near:    Bilateral Near:     Physical Exam Constitutional:      Appearance: Normal appearance.  HENT:     Head: Normocephalic.     Right Ear: Tympanic membrane, ear canal and external ear normal.     Left Ear: Tympanic membrane, ear canal and external ear normal.     Nose: Congestion and rhinorrhea present.     Mouth/Throat:     Mouth: Mucous membranes are moist.     Pharynx: No posterior oropharyngeal erythema.  Cardiovascular:     Rate and Rhythm: Normal rate and regular rhythm.     Pulses: Normal pulses.     Heart sounds: Normal heart sounds.  Pulmonary:     Effort: Pulmonary effort is normal.     Breath sounds: Normal breath sounds.  Skin:    General: Skin is warm and dry.  Neurological:     Mental Status: She is alert and oriented to person, place, and time. Mental status is at baseline.  Psychiatric:        Mood and Affect: Mood normal.        Behavior: Behavior normal.      UC Treatments / Results  Labs (all labs ordered are listed, but only abnormal results are displayed) Labs Reviewed  RESP PANEL BY RT-PCR (FLU A&B, COVID) [id number]    EKG   Radiology No results found.  Procedures Procedures (including critical care time)  Medications Ordered in UC Medications - No data to display  Initial Impression / Assessment and Plan / UC Course  I have reviewed the triage vital signs and the nursing notes.  Pertinent labs & imaging results that were available during my care of the patient were reviewed by me and considered in my medical decision making (see chart for details).  Influenza A  Confirmed by PCR, negative for COVID, Tamiflu prescribed, harsh and persistent cough witnessed in exam room, hycodan prescribed as Tessalon has been ineffective, recommended continued use of prednisone course as well  as albuterol inhaler as needed, may use additional over-the-counter medications for support with follow-up with urgent care as needed Final Clinical Impressions(s) / UC Diagnoses   Final  diagnoses:  None   Discharge Instructions   None    ED Prescriptions   None    PDMP not reviewed this encounter.   Valinda Hoar, [place] 07/31/22 [phone]

## 2022-07-31 NOTE — ED Triage Notes (Signed)
Patient c/o cough, fever, headache, and runny nose that started on Wed.  Patient states that she took 3 doses of Prednisone and has an inhaler to use.

## 2023-09-28 ENCOUNTER — Encounter: Payer: BC Managed Care – PPO | Admitting: Obstetrics and Gynecology
